# Patient Record
Sex: Female | Born: 1955 | Race: Black or African American | Hispanic: No | Smoking: Never smoker
Health system: Southern US, Community
[De-identification: ages and names within clinical notes are randomized; demographics above are authoritative.]

## PROBLEM LIST (undated history)

## (undated) DIAGNOSIS — I10 Essential (primary) hypertension: Secondary | ICD-10-CM

## (undated) HISTORY — PX: TUBAL LIGATION: SHX77

---

## 1998-08-27 ENCOUNTER — Ambulatory Visit (HOSPITAL_COMMUNITY): Admission: RE | Admit: 1998-08-27 | Discharge: 1998-08-27 | Payer: Self-pay | Admitting: Obstetrics and Gynecology

## 2002-11-14 ENCOUNTER — Other Ambulatory Visit: Admission: RE | Admit: 2002-11-14 | Discharge: 2002-11-14 | Payer: Self-pay | Admitting: Obstetrics and Gynecology

## 2004-01-23 ENCOUNTER — Other Ambulatory Visit: Admission: RE | Admit: 2004-01-23 | Discharge: 2004-01-23 | Payer: Self-pay | Admitting: Obstetrics and Gynecology

## 2005-02-16 ENCOUNTER — Other Ambulatory Visit: Admission: RE | Admit: 2005-02-16 | Discharge: 2005-02-16 | Payer: Self-pay | Admitting: Obstetrics and Gynecology

## 2007-04-01 ENCOUNTER — Encounter: Admission: RE | Admit: 2007-04-01 | Discharge: 2007-04-01 | Payer: Self-pay | Admitting: Obstetrics and Gynecology

## 2015-01-23 ENCOUNTER — Other Ambulatory Visit: Payer: Self-pay | Admitting: Surgical

## 2015-02-06 NOTE — H&P (Signed)
[name] is an 59 y.o. female.   Chief Complaint: back pain HPI: The patient is a 59 year old female who presents with the chief complaint of low back pain which radiates into the right leg. She reports that the pain started about a year ago with no known injury. She reports that it has gotten progressively worse with time despite treatment with NSAIDs, prednisone, and activity modification. She has also developed weakness in her right toe extensors and dorsiflexors. MRI of the lumbar spine shows that she has a large central and to the right ruptured disc with caudal extrusion.  No pertinent medical history  No pertinent surgical history  Social History: nonsmoker; drinks wine occasionally  Allergies: No known allergies  Medication  Diclofenac Sodium (  Tablet DR, Oral) Active. PredniSONE (  Tablet, Oral) Active. Percocet 5/325 1-2 every 4-6 hours PRN pain  Review of Systems  Constitutional: Positive for diaphoresis. Negative for fever, chills, weight loss and malaise/fatigue.  HENT: Negative.   Eyes: Negative.   Respiratory: Negative.   Cardiovascular: Negative.   Gastrointestinal: Negative.   Genitourinary: Negative.   Musculoskeletal: Positive for back pain. Negative for myalgias, joint pain, falls and neck pain.  Skin: Negative.   Neurological: Positive for tingling and sensory change. Negative for dizziness, tremors, speech change, focal weakness, seizures, loss of consciousness and weakness.  Endo/Heme/Allergies: Negative.   Psychiatric/Behavioral: Negative.    Vitals  Weight: 152 lb Height: 61in Body Surface Area: 1.68 m Body Mass Index: 28.72 kg/m  Pulse: 85 (Regular)  BP: 145/106 (Sitting, Left Arm, Standard)  Physical Exam  Constitutional: She is oriented to person, place, and time. She appears well-developed and well-nourished. No distress.  HENT:  Head: Normocephalic and atraumatic.  Right Ear: External ear normal.  Left Ear: External ear  normal.  Nose: Nose normal.  Mouth/Throat: Oropharynx is clear and moist.  Eyes: Conjunctivae and EOM are normal.  Neck: Normal range of motion. Neck supple.  Cardiovascular: Normal rate, regular rhythm, normal heart sounds and intact distal pulses.   No murmur heard. Respiratory: Effort normal and breath sounds normal. No respiratory distress. She has no wheezes.  GI: Soft. Bowel sounds are normal. She exhibits no distension. There is no tenderness.  Musculoskeletal:       Right hip: Normal.       Left hip: Normal.       Right knee: Normal.       Left knee: Normal.       Lumbar back: She exhibits pain and spasm.  Pain with motion of the lumbar spine which radiates to the right ankle  Neurological: She is alert and oriented to person, place, and time. She has normal reflexes. No sensory deficit.  Weakness of the toe extensors and dorsiflexors of the right foot  Skin: No rash noted. She is not diaphoretic. No erythema.  Psychiatric: She has a normal mood and affect. Her behavior is normal.     Assessment/Plan Lumbar disc herniation L4-L5 right She needs a central decompressive lumbar laminectomy anLillian Ballesterectomy at 4-5 on the right. Risks and benefits of the procedure discussed with the patient by Dr. Darrelyn Hillock.   Dina Mobley LAUREN 02/06/2015, 1:49 PM

## 2015-02-18 NOTE — Patient Instructions (Addendum)
[name]  02/18/2015   Your procedure is scheduled on: 02/22/2015    Report to [facility] Main  Entrance take [place]  elevators to 3rd floor to  Short Stay Center at     0515 AM.  Call this number if you have problems the morning of surgery [phone]   Remember: ONLY 1 PERSON MAY GO WITH YOU TO SHORT STAY TO GET  READY MORNING OF YOUR SURGERY.  Do not eat food or drink liquids :After Midnight.     Take these medicines the morning of surgery with A SIP OF WATER: none                                 You may not have any metal on your body including hair pins and              piercings  Do not wear jewelry, make-up, lotions, powders or perfumes, deodorant             Do not wear nail polish.  Do not shave  48 hours prior to surgery.                Do not bring valuables to the hospital. [facility] IS NOT             RESPONSIBLE   FOR VALUABLES.  Contacts, dentures or bridgework may not be worn into surgery.  Leave suitcase in the car. After surgery it may be brought to your room.         Special Instructions: coughing and deep breathing exercises, leg exercises               Please read over the following fact sheets you were given: _____________________________________________________________________             [facility] - Preparing for Surgery Before surgery, you can play an important role.  Because skin is not sterile, your skin needs to be as free of germs as possible.  You can reduce the number of germs on your skin by washing with CHG (chlorahexidine gluconate) soap before surgery.  CHG is an antiseptic cleaner which kills germs and bonds with the skin to continue killing germs even after washing. Please DO NOT use if you have an allergy to CHG or antibacterial soaps.  If your skin becomes reddened/irritated stop using the CHG and inform your nurse when you arrive at Short Stay. Do not shave (including legs and underarms) for at least 48  hours prior to the first CHG shower.  You may shave your face/neck. Please follow these instructions carefully:  1.  Shower with CHG Soap the night before surgery and the  morning of Surgery.  2.  If you choose to wash your hair, wash your hair first as usual with your  normal  shampoo.  3.  After you shampoo, rinse your hair and body thoroughly to remove the  shampoo.                           4.  Use CHG as you would any other liquid soap.  You can apply chg directly  to the skin and wash  Gently with a scrungie or clean washcloth.  5.  Apply the CHG Soap to your body ONLY FROM THE NECK DOWN.   Do not use on face/ open                           Wound or open sores. Avoid contact with eyes, ears mouth and genitals (private parts).                       Wash face,  Genitals (private parts) with your normal soap.             6.  Wash thoroughly, paying special attention to the area where your surgery  will be performed.  7.  Thoroughly rinse your body with warm water from the neck down.  8.  DO NOT shower/wash with your normal soap after using and rinsing off  the CHG Soap.                9.  Pat yourself dry with a clean towel.            10.  Wear clean pajamas.            11.  Place clean sheets on your bed the night of your first shower and do not  sleep with pets. Day of Surgery : Do not apply any lotions/deodorants the morning of surgery.  Please wear clean clothes to the hospital/surgery center.  FAILURE TO FOLLOW THESE INSTRUCTIONS MAY RESULT IN THE CANCELLATION OF YOUR SURGERY PATIENT SIGNATURE_________________________________  NURSE SIGNATURE__________________________________  ________________________________________________________________________  WHAT IS A BLOOD TRANSFUSION? Blood Transfusion Information  A transfusion is the replacement of blood or some of its parts. Blood is made up of multiple cells which provide different functions.  Red blood cells  carry oxygen and are used for blood loss replacement.  White blood cells fight against infection.  Platelets control bleeding.  Plasma helps clot blood.  Other blood products are available for specialized needs, such as hemophilia or other clotting disorders. BEFORE THE TRANSFUSION  Who gives blood for transfusions?   Healthy volunteers who are fully evaluated to make sure their blood is safe. This is blood bank blood. Transfusion therapy is the safest it has ever been in the practice of medicine. Before blood is taken from a donor, a complete history is taken to make sure that person has no history of diseases nor engages in risky social behavior (examples are intravenous drug use or sexual activity with multiple partners). The donor's travel history is screened to minimize risk of transmitting infections, such as malaria. The donated blood is tested for signs of infectious diseases, such as HIV and hepatitis. The blood is then tested to be sure it is compatible with you in order to minimize the chance of a transfusion reaction. If you or a relative donates blood, this is often done in anticipation of surgery and is not appropriate for emergency situations. It takes many days to process the donated blood. RISKS AND COMPLICATIONS Although transfusion therapy is very safe and saves many lives, the main dangers of transfusion include:  1. Getting an infectious disease. 2. Developing a transfusion reaction. This is an allergic reaction to something in the blood you were given. Every precaution is taken to prevent this. The decision to have a blood transfusion has been considered carefully by your caregiver before blood is given. Blood is not given unless the benefits outweigh  the risks. AFTER THE TRANSFUSION  Right after receiving a blood transfusion, you will usually feel much better and more energetic. This is especially true if your red blood cells have gotten low (anemic). The transfusion  raises the level of the red blood cells which carry oxygen, and this usually causes an energy increase.  The nurse administering the transfusion will monitor you carefully for complications. HOME CARE INSTRUCTIONS  No special instructions are needed after a transfusion. You may find your energy is better. Speak with your caregiver about any limitations on activity for underlying diseases you may have. SEEK MEDICAL CARE IF:   Your condition is not improving after your transfusion.  You develop redness or irritation at the intravenous (IV) site. SEEK IMMEDIATE MEDICAL CARE IF:  Any of the following symptoms occur over the next 12 hours:  Shaking chills.  You have a temperature by mouth above 102 F (38.9 C), not controlled by medicine.  Chest, back, or muscle pain.  People around you feel you are not acting correctly or are confused.  Shortness of breath or difficulty breathing.  Dizziness and fainting.  You get a rash or develop hives.  You have a decrease in urine output.  Your urine turns a dark color or changes to pink, red, or brown. Any of the following symptoms occur over the next 10 days:  You have a temperature by mouth above 102 F (38.9 C), not controlled by medicine.  Shortness of breath.  Weakness after normal activity.  The white part of the eye turns yellow (jaundice).  You have a decrease in the amount of urine or are urinating less often.  Your urine turns a dark color or changes to pink, red, or brown. Document Released: 04/24/2000 Document Revised: 07/20/2011 Document Reviewed: 12/12/2007 ExitCare Patient Information 2014 [place].  _______________________________________________________________________  Incentive Spirometer  An incentive spirometer is a tool that can help keep your lungs clear and active. This tool measures how well you are filling your lungs with each breath. Taking long deep breaths may help reverse or decrease the chance  of developing breathing (pulmonary) problems (especially infection) following:  A long period of time when you are unable to move or be active. BEFORE THE PROCEDURE   If the spirometer includes an indicator to show your best effort, your nurse or respiratory therapist will set it to a desired goal.  If possible, sit up straight or lean slightly forward. Try not to slouch.  Hold the incentive spirometer in an upright position. INSTRUCTIONS FOR USE  3. Sit on the edge of your bed if possible, or sit up as far as you can in bed or on a chair. 4. Hold the incentive spirometer in an upright position. 5. Breathe out normally. 6. Place the mouthpiece in your mouth and seal your lips tightly around it. 7. Breathe in slowly and as deeply as possible, raising the piston or the ball toward the top of the column. 8. Hold your breath for 3-5 seconds or for as long as possible. Allow the piston or ball to fall to the bottom of the column. 9. Remove the mouthpiece from your mouth and breathe out normally. 10. Rest for a few seconds and repeat Steps 1 through 7 at least 10 times every 1-2 hours when you are awake. Take your time and take a few normal breaths between deep breaths. 11. The spirometer may include an indicator to show your best effort. Use the indicator as a goal to work  toward during each repetition. 12. After each set of 10 deep breaths, practice coughing to be sure your lungs are clear. If you have an incision (the cut made at the time of surgery), support your incision when coughing by placing a pillow or rolled up towels firmly against it. Once you are able to get out of bed, walk around indoors and cough well. You may stop using the incentive spirometer when instructed by your caregiver.  RISKS AND COMPLICATIONS  Take your time so you do not get dizzy or light-headed.  If you are in pain, you may need to take or ask for pain medication before doing incentive spirometry. It is harder to  take a deep breath if you are having pain. AFTER USE  Rest and breathe slowly and easily.  It can be helpful to keep track of a log of your progress. Your caregiver can provide you with a simple table to help with this. If you are using the spirometer at home, follow these instructions: [place] IF:   You are having difficultly using the spirometer.  You have trouble using the spirometer as often as instructed.  Your pain medication is not giving enough relief while using the spirometer.  You develop fever of 100.5 F (38.1 C) or higher. SEEK IMMEDIATE MEDICAL CARE IF:   You cough up bloody sputum that had not been present before.  You develop fever of 102 F (38.9 C) or greater.  You develop worsening pain at or near the incision site. MAKE SURE YOU:   Understand these instructions.  Will watch your condition.  Will get help right away if you are not doing well or get worse. Document Released: 09/07/2006 Document Revised: 07/20/2011 Document Reviewed: 11/08/2006 [facility] Patient Information 2014 [place], [place].   ________________________________________________________________________

## 2015-02-19 ENCOUNTER — Ambulatory Visit (HOSPITAL_COMMUNITY)
Admission: RE | Admit: 2015-02-19 | Discharge: 2015-02-19 | Disposition: A | Discharge status: Home / Self Care | Payer: BLUE CROSS/BLUE SHIELD | Source: Ambulatory Visit | Attending: Surgical | Admitting: Surgical

## 2015-02-19 ENCOUNTER — Encounter (HOSPITAL_COMMUNITY): Payer: Self-pay

## 2015-02-19 ENCOUNTER — Encounter (HOSPITAL_COMMUNITY)
Admission: RE | Admit: 2015-02-19 | Discharge: 2015-02-19 | Disposition: A | Discharge status: Home / Self Care | Payer: BLUE CROSS/BLUE SHIELD | Source: Ambulatory Visit | Attending: Orthopedic Surgery | Admitting: Orthopedic Surgery

## 2015-02-19 DIAGNOSIS — Z01812 Encounter for preprocedural laboratory examination: Secondary | ICD-10-CM | POA: Insufficient documentation

## 2015-02-19 DIAGNOSIS — Z01818 Encounter for other preprocedural examination: Secondary | ICD-10-CM | POA: Diagnosis present

## 2015-02-19 DIAGNOSIS — M5126 Other intervertebral disc displacement, lumbar region: Secondary | ICD-10-CM | POA: Diagnosis not present

## 2015-02-19 DIAGNOSIS — R9431 Abnormal electrocardiogram [ECG] [EKG]: Secondary | ICD-10-CM | POA: Diagnosis not present

## 2015-02-19 HISTORY — DX: Essential (primary) hypertension: I10

## 2015-02-19 LAB — CBC WITH DIFFERENTIAL/PLATELET
Basophils Absolute: 0 10*3/uL (ref 0.0–0.1)
Basophils Relative: 0 %
Eosinophils Absolute: 0.1 10*3/uL (ref 0.0–0.7)
Eosinophils Relative: 1 %
HCT: 42.2 % (ref 36.0–46.0)
Hemoglobin: 13.9 g/dL (ref 12.0–15.0)
Lymphocytes Relative: 50 %
Lymphs Abs: 2.4 10*3/uL (ref 0.7–4.0)
MCH: 30.3 pg (ref 26.0–34.0)
MCHC: 32.9 g/dL (ref 30.0–36.0)
MCV: 92.1 fL (ref 78.0–100.0)
Monocytes Absolute: 0.4 10*3/uL (ref 0.1–1.0)
Monocytes Relative: 8 %
Neutro Abs: 2 10*3/uL (ref 1.7–7.7)
Neutrophils Relative %: 41 %
Platelets: 240 10*3/uL (ref 150–400)
RBC: 4.58 MIL/uL (ref 3.87–5.11)
RDW: 13 % (ref 11.5–15.5)
WBC: 4.8 10*3/uL (ref 4.0–10.5)

## 2015-02-19 LAB — COMPREHENSIVE METABOLIC PANEL
ALT: 13 U/L — ABNORMAL LOW (ref 14–54)
AST: 20 U/L (ref 15–41)
Albumin: 3.8 g/dL (ref 3.5–5.0)
Alkaline Phosphatase: 84 U/L (ref 38–126)
Anion gap: 4 — ABNORMAL LOW (ref 5–15)
BUN: 17 mg/dL (ref 6–20)
CO2: 30 mmol/L (ref 22–32)
Calcium: 9.6 mg/dL (ref 8.9–10.3)
Chloride: 106 mmol/L (ref 101–111)
Creatinine, Ser: 0.92 mg/dL (ref 0.44–1.00)
GFR calc Af Amer: >60 mL/min (ref >60)
GFR calc non Af Amer: >60 mL/min (ref >60)
Glucose, Bld: 92 mg/dL (ref 65–99)
Potassium: 4 mmol/L (ref 3.5–5.1)
Sodium: 140 mmol/L (ref 135–145)
Total Bilirubin: 0.5 mg/dL (ref 0.3–1.2)
Total Protein: 7.5 g/dL (ref 6.5–8.1)

## 2015-02-19 LAB — PROTIME-INR
INR: 1.04 (ref 0.00–1.49)
Prothrombin Time: 13.8 seconds (ref 11.6–15.2)

## 2015-02-19 LAB — SURGICAL PCR SCREEN
MRSA, PCR: NEGATIVE
STAPHYLOCOCCUS AUREUS: NEGATIVE

## 2015-02-19 NOTE — Progress Notes (Signed)
Final EKG done 02/19/15 in EPIC.

## 2015-02-22 ENCOUNTER — Ambulatory Visit (HOSPITAL_COMMUNITY): Payer: BLUE CROSS/BLUE SHIELD | Admitting: Anesthesiology

## 2015-02-22 ENCOUNTER — Ambulatory Visit (HOSPITAL_COMMUNITY): Payer: BLUE CROSS/BLUE SHIELD

## 2015-02-22 ENCOUNTER — Encounter (HOSPITAL_COMMUNITY): Payer: Self-pay | Admitting: *Deleted

## 2015-02-22 ENCOUNTER — Observation Stay (HOSPITAL_COMMUNITY)
Admission: RE | Admit: 2015-02-22 | Discharge: 2015-02-23 | Disposition: A | Discharge status: Home / Self Care | Payer: BLUE CROSS/BLUE SHIELD | Source: Ambulatory Visit | Attending: Orthopedic Surgery | Admitting: Orthopedic Surgery

## 2015-02-22 ENCOUNTER — Encounter (HOSPITAL_COMMUNITY)
Admission: RE | Disposition: A | Discharge status: Home / Self Care | Payer: Self-pay | Source: Ambulatory Visit | Attending: Orthopedic Surgery

## 2015-02-22 DIAGNOSIS — M4806 Spinal stenosis, lumbar region: Secondary | ICD-10-CM | POA: Diagnosis present

## 2015-02-22 DIAGNOSIS — M48062 Spinal stenosis, lumbar region with neurogenic claudication: Secondary | ICD-10-CM | POA: Diagnosis present

## 2015-02-22 DIAGNOSIS — M545 Low back pain, unspecified: Secondary | ICD-10-CM

## 2015-02-22 DIAGNOSIS — M21371 Foot drop, right foot: Secondary | ICD-10-CM | POA: Insufficient documentation

## 2015-02-22 DIAGNOSIS — M5126 Other intervertebral disc displacement, lumbar region: Principal | ICD-10-CM | POA: Insufficient documentation

## 2015-02-22 DIAGNOSIS — I10 Essential (primary) hypertension: Secondary | ICD-10-CM | POA: Insufficient documentation

## 2015-02-22 HISTORY — PX: LUMBAR LAMINECTOMY/DECOMPRESSION MICRODISCECTOMY: SHX5026

## 2015-02-22 SURGERY — LUMBAR LAMINECTOMY/DECOMPRESSION MICRODISCECTOMY 1 LEVEL
Anesthesia: General | Site: Back | Laterality: Right

## 2015-02-22 MED ORDER — HYDROMORPHONE HCL 1 MG/ML IJ SOLN
0.5000 mg | INTRAMUSCULAR | Status: DC | PRN
  Filled 2015-02-22: qty 1

## 2015-02-22 MED ORDER — ACETAMINOPHEN 650 MG RE SUPP: 650.0000 mg | RECTAL | Status: DC | PRN

## 2015-02-22 MED ORDER — DEXAMETHASONE SODIUM PHOSPHATE 10 MG/ML IJ SOLN
INTRAMUSCULAR | Status: AC
  Filled 2015-02-22: qty 1

## 2015-02-22 MED ORDER — BACITRACIN-NEOMYCIN-POLYMYXIN 400-5-5000 EX OINT
TOPICAL_OINTMENT | CUTANEOUS | Status: AC
  Filled 2015-02-22: qty 1

## 2015-02-22 MED ORDER — CHLORHEXIDINE GLUCONATE 4 % EX LIQD: 60.0000 mL | Freq: Once | CUTANEOUS | Status: DC

## 2015-02-22 MED ORDER — CEFAZOLIN SODIUM-DEXTROSE 2-3 GM-% IV SOLR
2.0000 g | INTRAVENOUS | Status: AC
  Administered 2015-02-22: 2 g via INTRAVENOUS

## 2015-02-22 MED ORDER — BUPIVACAINE-EPINEPHRINE 0.5% -1:200000 IJ SOLN
INTRAMUSCULAR | Status: DC | PRN
  Administered 2015-02-22: 20 mL

## 2015-02-22 MED ORDER — BUPIVACAINE LIPOSOME 1.3 % IJ SUSP
20.0000 mL | Freq: Once | INTRAMUSCULAR | Status: AC
  Administered 2015-02-22: 20 mL
  Filled 2015-02-22 (×2): qty 20

## 2015-02-22 MED ORDER — CEFAZOLIN SODIUM 1-5 GM-% IV SOLN
1.0000 g | Freq: Three times a day (TID) | INTRAVENOUS | Status: AC
  Administered 2015-02-22 – 2015-02-23 (×3): 1 g via INTRAVENOUS
  Filled 2015-02-22 (×3): qty 50

## 2015-02-22 MED ORDER — PROMETHAZINE HCL 25 MG/ML IJ SOLN: 6.2500 mg | INTRAMUSCULAR | Status: DC | PRN

## 2015-02-22 MED ORDER — LIP MEDEX EX OINT
TOPICAL_OINTMENT | CUTANEOUS | Status: AC
  Administered 2015-02-22: 12:00:00
  Filled 2015-02-22: qty 7

## 2015-02-22 MED ORDER — METHOCARBAMOL 500 MG PO TABS: 500.0000 mg | ORAL_TABLET | Freq: Four times a day (QID) | ORAL | Status: DC | PRN

## 2015-02-22 MED ORDER — METHOCARBAMOL 1000 MG/10ML IJ SOLN
500.0000 mg | Freq: Four times a day (QID) | INTRAMUSCULAR | Status: DC | PRN
  Filled 2015-02-22: qty 5

## 2015-02-22 MED ORDER — ONDANSETRON HCL 4 MG/2ML IJ SOLN
INTRAMUSCULAR | Status: AC
  Filled 2015-02-22: qty 2

## 2015-02-22 MED ORDER — LACTATED RINGERS IV SOLN
INTRAVENOUS | Status: DC
  Administered 2015-02-22 (×2): via INTRAVENOUS

## 2015-02-22 MED ORDER — MENTHOL 3 MG MT LOZG: 1.0000 | LOZENGE | OROMUCOSAL | Status: DC | PRN

## 2015-02-22 MED ORDER — PROPOFOL 10 MG/ML IV BOLUS
INTRAVENOUS | Status: AC
  Filled 2015-02-22: qty 20

## 2015-02-22 MED ORDER — OXYCODONE-ACETAMINOPHEN 5-325 MG PO TABS
1.0000 | ORAL_TABLET | ORAL | Status: DC | PRN
  Filled 2015-02-22: qty 2

## 2015-02-22 MED ORDER — ESMOLOL HCL 10 MG/ML IV SOLN
INTRAVENOUS | Status: DC | PRN
  Administered 2015-02-22: 10 mg via INTRAVENOUS

## 2015-02-22 MED ORDER — PHENYLEPHRINE HCL 10 MG/ML IJ SOLN
10.0000 mg | INTRAVENOUS | Status: DC | PRN
  Administered 2015-02-22: 50 ug/min via INTRAVENOUS

## 2015-02-22 MED ORDER — BISACODYL 5 MG PO TBEC: 5.0000 mg | DELAYED_RELEASE_TABLET | Freq: Every day | ORAL | Status: DC | PRN

## 2015-02-22 MED ORDER — PHENYLEPHRINE 40 MCG/ML (10ML) SYRINGE FOR IV PUSH (FOR BLOOD PRESSURE SUPPORT)
PREFILLED_SYRINGE | INTRAVENOUS | Status: AC
  Filled 2015-02-22: qty 10

## 2015-02-22 MED ORDER — ONDANSETRON HCL 4 MG/2ML IJ SOLN
4.0000 mg | INTRAMUSCULAR | Status: DC | PRN
  Administered 2015-02-22: 4 mg via INTRAVENOUS
  Filled 2015-02-22: qty 2

## 2015-02-22 MED ORDER — METHOCARBAMOL 500 MG PO TABS: 500.0000 mg | ORAL_TABLET | Freq: Four times a day (QID) | ORAL | Status: AC | PRN

## 2015-02-22 MED ORDER — FLEET ENEMA 7-19 GM/118ML RE ENEM: 1.0000 | ENEMA | Freq: Once | RECTAL | Status: DC | PRN

## 2015-02-22 MED ORDER — DEXAMETHASONE SODIUM PHOSPHATE 10 MG/ML IJ SOLN
INTRAMUSCULAR | Status: DC | PRN
  Administered 2015-02-22: 10 mg via INTRAVENOUS

## 2015-02-22 MED ORDER — BUPIVACAINE-EPINEPHRINE (PF) 0.5% -1:200000 IJ SOLN
INTRAMUSCULAR | Status: AC
  Filled 2015-02-22: qty 30

## 2015-02-22 MED ORDER — MIDAZOLAM HCL 2 MG/2ML IJ SOLN
INTRAMUSCULAR | Status: AC
  Filled 2015-02-22: qty 4

## 2015-02-22 MED ORDER — HYDROMORPHONE HCL 1 MG/ML IJ SOLN
0.2500 mg | INTRAMUSCULAR | Status: DC | PRN
  Administered 2015-02-22 (×2): 0.5 mg via INTRAVENOUS

## 2015-02-22 MED ORDER — METOCLOPRAMIDE HCL 5 MG/ML IJ SOLN
INTRAMUSCULAR | Status: AC
  Filled 2015-02-22: qty 2

## 2015-02-22 MED ORDER — POLYETHYLENE GLYCOL 3350 17 G PO PACK: 17.0000 g | PACK | Freq: Every day | ORAL | Status: DC | PRN

## 2015-02-22 MED ORDER — SODIUM CHLORIDE 0.9 % IR SOLN
Status: AC
  Filled 2015-02-22: qty 1

## 2015-02-22 MED ORDER — MIDAZOLAM HCL 5 MG/5ML IJ SOLN
INTRAMUSCULAR | Status: DC | PRN
  Administered 2015-02-22: 1 mg via INTRAVENOUS

## 2015-02-22 MED ORDER — BACITRACIN-NEOMYCIN-POLYMYXIN 400-5-5000 EX OINT
TOPICAL_OINTMENT | CUTANEOUS | Status: DC | PRN
  Administered 2015-02-22: 1 via TOPICAL

## 2015-02-22 MED ORDER — HYDROMORPHONE HCL 1 MG/ML IJ SOLN
INTRAMUSCULAR | Status: DC | PRN
  Administered 2015-02-22 (×2): 0.5 mg via INTRAVENOUS

## 2015-02-22 MED ORDER — CEFAZOLIN SODIUM-DEXTROSE 2-3 GM-% IV SOLR
INTRAVENOUS | Status: AC
  Filled 2015-02-22: qty 50

## 2015-02-22 MED ORDER — OXYCODONE-ACETAMINOPHEN 5-325 MG PO TABS: 1.0000 | ORAL_TABLET | ORAL | Status: AC | PRN

## 2015-02-22 MED ORDER — EPHEDRINE SULFATE 50 MG/ML IJ SOLN
INTRAMUSCULAR | Status: AC
  Filled 2015-02-22: qty 1

## 2015-02-22 MED ORDER — HYDROCODONE-ACETAMINOPHEN 5-325 MG PO TABS
1.0000 | ORAL_TABLET | ORAL | Status: DC | PRN
  Administered 2015-02-22 – 2015-02-23 (×6): 2 via ORAL
  Filled 2015-02-22 (×6): qty 2

## 2015-02-22 MED ORDER — ONDANSETRON HCL 4 MG/2ML IJ SOLN
INTRAMUSCULAR | Status: DC | PRN
  Administered 2015-02-22: 4 mg via INTRAVENOUS

## 2015-02-22 MED ORDER — FENTANYL CITRATE (PF) 100 MCG/2ML IJ SOLN
INTRAMUSCULAR | Status: DC | PRN
  Administered 2015-02-22: 100 ug via INTRAVENOUS
  Administered 2015-02-22: 50 ug via INTRAVENOUS
  Administered 2015-02-22: 100 ug via INTRAVENOUS

## 2015-02-22 MED ORDER — HYDROMORPHONE HCL 1 MG/ML IJ SOLN
INTRAMUSCULAR | Status: AC
  Filled 2015-02-22: qty 1

## 2015-02-22 MED ORDER — HYDROMORPHONE HCL 2 MG/ML IJ SOLN
INTRAMUSCULAR | Status: AC
  Filled 2015-02-22: qty 1

## 2015-02-22 MED ORDER — LACTATED RINGERS IV SOLN
INTRAVENOUS | Status: DC
  Administered 2015-02-22: 12:00:00 via INTRAVENOUS

## 2015-02-22 MED ORDER — ROCURONIUM BROMIDE 100 MG/10ML IV SOLN
INTRAVENOUS | Status: DC | PRN
  Administered 2015-02-22 (×2): 10 mg via INTRAVENOUS
  Administered 2015-02-22: 40 mg via INTRAVENOUS

## 2015-02-22 MED ORDER — LIDOCAINE HCL (CARDIAC) 20 MG/ML IV SOLN
INTRAVENOUS | Status: DC | PRN
  Administered 2015-02-22: 50 mg via INTRAVENOUS

## 2015-02-22 MED ORDER — SUGAMMADEX SODIUM 500 MG/5ML IV SOLN
INTRAVENOUS | Status: AC
Start: 2015-02-22 — End: 2015-02-22
  Filled 2015-02-22: qty 5

## 2015-02-22 MED ORDER — LIDOCAINE HCL (CARDIAC) 20 MG/ML IV SOLN
INTRAVENOUS | Status: AC
  Filled 2015-02-22: qty 10

## 2015-02-22 MED ORDER — SODIUM CHLORIDE 0.9 % IR SOLN
Status: DC | PRN
  Administered 2015-02-22: 500 mL

## 2015-02-22 MED ORDER — SUGAMMADEX SODIUM 500 MG/5ML IV SOLN
INTRAVENOUS | Status: DC | PRN
  Administered 2015-02-22: 400 mg via INTRAVENOUS

## 2015-02-22 MED ORDER — PHENYLEPHRINE HCL 10 MG/ML IJ SOLN
INTRAMUSCULAR | Status: DC | PRN
  Administered 2015-02-22 (×5): 80 ug via INTRAVENOUS

## 2015-02-22 MED ORDER — SODIUM CHLORIDE 0.9 % IJ SOLN
INTRAMUSCULAR | Status: AC
  Filled 2015-02-22: qty 10

## 2015-02-22 MED ORDER — PROPOFOL 10 MG/ML IV BOLUS
INTRAVENOUS | Status: DC | PRN
  Administered 2015-02-22: 140 mg via INTRAVENOUS

## 2015-02-22 MED ORDER — ACETAMINOPHEN 325 MG PO TABS
650.0000 mg | ORAL_TABLET | ORAL | Status: DC | PRN
Start: 2015-02-22 — End: 2015-02-23

## 2015-02-22 MED ORDER — FENTANYL CITRATE (PF) 250 MCG/5ML IJ SOLN
INTRAMUSCULAR | Status: AC
  Filled 2015-02-22: qty 25

## 2015-02-22 MED ORDER — PHENOL 1.4 % MT LIQD
1.0000 | OROMUCOSAL | Status: DC | PRN
  Filled 2015-02-22: qty 177

## 2015-02-22 MED ORDER — THROMBIN 5000 UNITS EX SOLR
OROMUCOSAL | Status: DC | PRN
  Administered 2015-02-22: 09:00:00 via TOPICAL

## 2015-02-22 MED ORDER — PHENYLEPHRINE HCL 10 MG/ML IJ SOLN
INTRAMUSCULAR | Status: AC
Start: 2015-02-22 — End: 2015-02-22
  Filled 2015-02-22: qty 1

## 2015-02-22 MED ORDER — ROCURONIUM BROMIDE 100 MG/10ML IV SOLN
INTRAVENOUS | Status: AC
  Filled 2015-02-22: qty 1

## 2015-02-22 MED ORDER — THROMBIN 5000 UNITS EX SOLR
CUTANEOUS | Status: AC
  Filled 2015-02-22: qty 10000

## 2015-02-22 MED ORDER — METOCLOPRAMIDE HCL 5 MG/ML IJ SOLN
INTRAMUSCULAR | Status: DC | PRN
  Administered 2015-02-22: 10 mg via INTRAVENOUS

## 2015-02-22 MED ORDER — ESMOLOL HCL 10 MG/ML IV SOLN
INTRAVENOUS | Status: AC
  Filled 2015-02-22: qty 10

## 2015-02-22 SURGICAL SUPPLY — 40 items
BAG SPEC THK2 15X12 ZIP CLS (MISCELLANEOUS) ×1
BAG ZIPLOCK 12X15 (MISCELLANEOUS) ×3 IMPLANT
CLEANER TIP ELECTROSURG 2X2 (MISCELLANEOUS) ×3 IMPLANT
DRAPE MICROSCOPE LEICA (MISCELLANEOUS) ×3 IMPLANT
DRAPE POUCH INSTRU U-SHP 10X18 (DRAPES) ×3 IMPLANT
DRAPE SHEET LG 3/4 BI-LAMINATE (DRAPES) ×3 IMPLANT
DRAPE SURG 17X11 SM STRL (DRAPES) ×3 IMPLANT
DRSG ADAPTIC 3X8 NADH LF (GAUZE/BANDAGES/DRESSINGS) ×3 IMPLANT
DRSG PAD ABDOMINAL 8X10 ST (GAUZE/BANDAGES/DRESSINGS) ×4 IMPLANT
DURAPREP 26ML APPLICATOR (WOUND CARE) ×3 IMPLANT
ELECT BLADE TIP CTD 4 INCH (ELECTRODE) ×3 IMPLANT
ELECT REM PT RETURN 9FT ADLT (ELECTROSURGICAL) ×3
ELECTRODE REM PT RTRN 9FT ADLT (ELECTROSURGICAL) ×1 IMPLANT
GAUZE SPONGE 4X4 12PLY STRL (GAUZE/BANDAGES/DRESSINGS) ×3 IMPLANT
GLOVE BIOGEL PI IND STRL 8 (GLOVE) ×1 IMPLANT
GLOVE BIOGEL PI INDICATOR 8 (GLOVE) ×4
GLOVE ECLIPSE 8.0 STRL XLNG CF (GLOVE) ×5 IMPLANT
GOWN STRL REUS W/TWL XL LVL3 (GOWN DISPOSABLE) ×6 IMPLANT
KIT BASIN OR (CUSTOM PROCEDURE TRAY) ×3 IMPLANT
KIT POSITIONING SURG ANDREWS (MISCELLANEOUS) ×3 IMPLANT
MANIFOLD NEPTUNE II (INSTRUMENTS) ×3 IMPLANT
NDL SPNL 18GX3.5 QUINCKE PK (NEEDLE) ×2 IMPLANT
NEEDLE HYPO 22GX1.5 SAFETY (NEEDLE) ×3 IMPLANT
NEEDLE SPNL 18GX3.5 QUINCKE PK (NEEDLE) ×6 IMPLANT
PACK LAMINECTOMY ORTHO (CUSTOM PROCEDURE TRAY) ×3 IMPLANT
PATTIES SURGICAL .5 X.5 (GAUZE/BANDAGES/DRESSINGS) ×1 IMPLANT
PATTIES SURGICAL .75X.75 (GAUZE/BANDAGES/DRESSINGS) ×2 IMPLANT
PATTIES SURGICAL 1X1 (DISPOSABLE) IMPLANT
PEN SKIN MARKING BROAD (MISCELLANEOUS) ×3 IMPLANT
RUBBERBAND STERILE (MISCELLANEOUS) ×2 IMPLANT
SPONGE LAP 4X18 X RAY DECT (DISPOSABLE) ×4 IMPLANT
SPONGE SURGIFOAM ABS GEL 100 (HEMOSTASIS) ×3 IMPLANT
STAPLER VISISTAT 35W (STAPLE) ×3 IMPLANT
SUT VIC AB 0 CT1 27 (SUTURE) ×3
SUT VIC AB 0 CT1 27XBRD ANTBC (SUTURE) ×1 IMPLANT
SUT VIC AB 1 CT1 27 (SUTURE) ×9
SUT VIC AB 1 CT1 27XBRD ANTBC (SUTURE) ×3 IMPLANT
SYR 20CC LL (SYRINGE) ×3 IMPLANT
TAPE CLOTH SURG 4X10 WHT LF (GAUZE/BANDAGES/DRESSINGS) ×2 IMPLANT
TOWEL OR 17X26 10 PK STRL BLUE (TOWEL DISPOSABLE) ×3 IMPLANT

## 2015-02-22 NOTE — Anesthesia Postprocedure Evaluation (Signed)
  Anesthesia Post-op Note  Patient: Angela PimpleCheryl Waller  Procedure(s) Performed: Procedure(s) (LRB): CENTRAL DECOMPRESSION LUMBAR LAMINECTOMY MICRODISCECTOMY L4-L5 RIGHT   (1 LEVEL) (Right)  Patient Location: PACU  Anesthesia Type: General  Level of Consciousness: awake and alert   Airway and Oxygen Therapy: Patient Spontanous Breathing  Post-op Pain: mild  Post-op Assessment: Post-op Vital signs reviewed, Patient's Cardiovascular Status Stable, Respiratory Function Stable, Patent Airway and No signs of Nausea or vomiting  Last Vitals:  Filed Vitals:   02/22/15 1100  BP: 150/98  Pulse: 77  Temp: 36.6 C  Resp: 12    Post-op Vital Signs: stable   Complications: No apparent anesthesia complications

## 2015-02-22 NOTE — Interval H&P Note (Signed)
History and Physical Interval Note:  02/22/2015 6:55 AM  [name]  has presented today for surgery, with the diagnosis of HERNIATED DISC  The various methods of treatment have been discussed with the patient and family. After consideration of risks, benefits and other options for treatment, the patient has consented to  Procedure(s): CENTRAL DECOMPRESSION LUMBAR LAMINECTOMY MICRODISCECTOMY L4-L5 RIGHT   (1 LEVEL) (Right) as a surgical intervention .  The patient's history has been reviewed, patient examined, no change in status, stable for surgery.  I have reviewed the patient's chart and labs.  Questions were answered to the patient's satisfaction.     Damarco Keysor A

## 2015-02-22 NOTE — Progress Notes (Signed)
Pt is admitted to [facility] [address] from PACU. Admission VS is stable. Pt is AOx4 with no neuro deficit.

## 2015-02-22 NOTE — Anesthesia Procedure Notes (Signed)
Procedure Name: Intubation Date/Time: 02/22/2015 7:14 AM Performed by: Elektra Wartman, Nuala AlphaKRISTOPHER Pre-anesthesia Checklist: Patient identified, Emergency Drugs available, Suction available, Patient being monitored and Timeout performed Patient Re-evaluated:Patient Re-evaluated prior to inductionOxygen Delivery Method: Circle system utilized Preoxygenation: Pre-oxygenation with 100% oxygen Intubation Type: IV induction Ventilation: Mask ventilation without difficulty Laryngoscope Size: Mac and 4 Grade View: Grade II Tube type: Oral Tube size: 7.5 mm Number of attempts: 1 Airway Equipment and Method: Stylet Placement Confirmation: ETT inserted through vocal cords under direct vision,  positive ETCO2,  CO2 detector and breath sounds checked- equal and bilateral Secured at: 21 cm Tube secured with: Tape Dental Injury: Teeth and Oropharynx as per pre-operative assessment

## 2015-02-22 NOTE — Brief Op Note (Signed)
02/22/2015  9:23 AM  PATIENT:  [name]  59 y.o. female  PRE-OPERATIVE DIAGNOSIS:  HERNIATED DISC at L-4-L-5 on the right,Spinal Stenosis at L-4-L-5 and Foot Drop on the Right.  POST-OPERATIVE DIAGNOSIS:  Same as Pre-Op  PROCEDURE:  Procedure(s): CENTRAL DECOMPRESSION LUMBAR LAMINECTOMY MICRODISCECTOMY L4-L5 RIGHT   (1 LEVEL) (Right) for HNP and Spinal Stenosis.Foraminotomies for L-4 and L-5 Nerve roots for Foraminal Stenosis.  SURGEON:  Surgeon(s) and Role:    * Ranee Gosselinonald Ordean Fouts, MD - Primary    * Drucilla SchmidtJames P Aplington, MD - Assisting     ASSISTANTS:James Aplington MD  ANESTHESIA:   general  EBL:  Total I/O In: 1000 [I.V.:1000] Out: 100 [Blood:100]  BLOOD ADMINISTERED:none  DRAINS: none   LOCAL MEDICATIONS USED:  MARCAINE 20cc of 0.50% with Epinephrine at start of case and 20cc of Exparel at the end of the case.    SPECIMEN:  Source of Specimen:  L-4-L-5  DISPOSITION OF SPECIMEN:  PATHOLOGY  COUNTS:  YES  TOURNIQUET:  * No tourniquets in log *  DICTATION: .Other Dictation: Dictation Number [id number]  PLAN OF CARE: Admit for overnight observation  PATIENT DISPOSITION:  PACU - hemodynamically stable.   Delay start of Pharmacological VTE agent (>24hrs) due to surgical blood loss or risk of bleeding: yes

## 2015-02-22 NOTE — Anesthesia Preprocedure Evaluation (Signed)
Anesthesia Evaluation  Patient identified by MRN, date of birth, ID band Patient awake    Reviewed: Allergy & Precautions, NPO status , Patient's Chart, lab work & pertinent test results  Airway Mallampati: II  TM Distance: >3 FB Neck ROM: Full    Dental no notable dental hx.    Pulmonary neg pulmonary ROS,    Pulmonary exam normal breath sounds clear to auscultation       Cardiovascular hypertension, negative cardio ROS Normal cardiovascular exam Rhythm:Regular Rate:Normal     Neuro/Psych negative neurological ROS  negative psych ROS   GI/Hepatic negative GI ROS, Neg liver ROS,   Endo/Other  negative endocrine ROS  Renal/GU negative Renal ROS  negative genitourinary   Musculoskeletal negative musculoskeletal ROS (+)   Abdominal   Peds negative pediatric ROS (+)  Hematology negative hematology ROS (+)   Anesthesia Other Findings   Reproductive/Obstetrics negative OB ROS                             Anesthesia Physical  Anesthesia Plan  ASA: II  Anesthesia Plan: General   Post-op Pain Management:    Induction: Intravenous  Airway Management Planned: Oral ETT  Additional Equipment:   Intra-op Plan:   Post-operative Plan: Extubation in OR  Informed Consent: I have reviewed the patients History and Physical, chart, labs and discussed the procedure including the risks, benefits and alternatives for the proposed anesthesia with the patient or authorized representative who has indicated his/her understanding and acceptance.   Dental advisory given  Plan Discussed with: CRNA  Anesthesia Plan Comments:         Anesthesia Quick Evaluation  

## 2015-02-22 NOTE — Discharge Instructions (Addendum)
No driving while on pain medications and no lifting °Take aspirin 325mg daily to prevent blood clots °For the three few days, remove your dressing, tape a piece of saran wrap over your incision. °Take your shower, then remove the saran wrap and put a clean dressing on.. °After three days you can shower without the saran wrap.  °Call Dr. Gioffre if any wound complications or temperature of 101 degrees F or over.  °Call the office for an appointment to see Dr. Gioffre in two weeks: [phone] and ask for Dr. Gioffre's nurse, Tammy Johnson.  °

## 2015-02-22 NOTE — Transfer of Care (Signed)
Immediate Anesthesia Transfer of Care Note  Patient: [name]  Procedure(s) Performed: Procedure(s): CENTRAL DECOMPRESSION LUMBAR LAMINECTOMY MICRODISCECTOMY L4-L5 RIGHT   (1 LEVEL) (Right)  Patient Location: PACU  Anesthesia Type:General  Level of Consciousness:  sedated, patient cooperative and responds to stimulation  Airway & Oxygen Therapy:Patient Spontanous Breathing and Patient connected to face mask oxgen  Post-op Assessment:  Report given to PACU RN and Post -op Vital signs reviewed and stable  Post vital signs:  Reviewed and stable  Last Vitals:  Filed Vitals:   02/22/15 0531  BP: 155/101  Pulse:   Temp:   Resp:     Complications: No apparent anesthesia complications

## 2015-02-23 DIAGNOSIS — M5126 Other intervertebral disc displacement, lumbar region: Secondary | ICD-10-CM | POA: Diagnosis not present

## 2015-02-23 NOTE — Progress Notes (Signed)
Discharged from floor via w/c, family & belongings with pt. No changes in assessment. [name]  

## 2015-02-23 NOTE — Progress Notes (Signed)
   Subjective: 1 Day Post-Op Procedure(s) (LRB): CENTRAL DECOMPRESSION LUMBAR LAMINECTOMY MICRODISCECTOMY L4-L5 RIGHT   (1 LEVEL) (Right)  Pt up walking with therapy with mild discomfort Plan for d/c home today Doing well Patient reports pain as mild.  Objective:   VITALS:   Filed Vitals:   02/23/15 0540  BP: 135/73  Pulse: 84  Temp: 98.6 F (37 C)  Resp: 16    Lumbar incision healing well nv intact distally Good gait with walker  LABS No results for input(s): HGB, HCT, WBC, PLT in the last 72 hours.  No results for input(s): NA, K, BUN, CREATININE, GLUCOSE in the last 72 hours.   Assessment/Plan: 1 Day Post-Op Procedure(s) (LRB): CENTRAL DECOMPRESSION LUMBAR LAMINECTOMY MICRODISCECTOMY L4-L5 RIGHT   (1 LEVEL) (Right) D/c home today F/u in 2 weeks with Dr. Darrelyn HillockGioffre Pain management as needed    [name], MPAS, PA-C  02/23/2015, 8:45 AM

## 2015-02-23 NOTE — Care Management Note (Addendum)
Case Management Note  Patient Details  Name: Sharee PimpleCheryl Kleist MRN: [id number] Date of Birth: 10/01/1955  Subjective/Objective:        Microdiskectomy L4-5            Action/Plan: Pt requesting RW for home. Contacted [facility] for DME for home. Pt states her husband and sister will be at home to assist with her care.  Expected Discharge Date:  02/23/2015               Expected Discharge Plan:  Home/Self Care  In-House Referral:     Discharge planning Services  CM Consult  Post Acute Care Choice:    Choice offered to:     DME Arranged:  Walker rolling DME Agency:  [facility]   Status of Service:  Completed, signed off  Medicare Important Message Given:    Date Medicare IM Given:    Medicare IM give by:    Date Additional Medicare IM Given:    Additional Medicare Important Message give by:     If discussed at Long Length of Stay Meetings, dates discussed:    Additional Comments:  Elliot CousinShavis, Loura Pitt Ellen, RN 02/23/2015, 10:44 AM

## 2015-02-23 NOTE — Progress Notes (Signed)
Occupational Therapy Evaluation Patient Details Name: [name] MRN: [id number] DOB: 02/19/1956 Today's Date: 02/23/2015    History of Present Illness microdiscectomy L4-5   Clinical Impression   Patient presents to OT with decreased ADL independence due to pain and back precautions. She was educated in all ADL techniques and available equipment. No further OT needs. Spouse to assist as needed with ADLs at home.    Follow Up Recommendations  No OT follow up;Supervision - Intermittent    Equipment Recommendations  None recommended by OT    Recommendations for Other Services       Precautions / Restrictions Precautions Precautions: Back Precaution Comments: reviewed back precautions with patient      Mobility              Balance                                            ADL Overall ADL's : Needs assistance/impaired Eating/Feeding: Independent   Grooming: Wash/dry hands;Wash/dry face;Supervision/safety   Upper Body Bathing: Set up;Sitting   Lower Body Bathing: Minimal assistance;Moderate assistance;Sit to/from stand   Upper Body Dressing : Set up;Sitting   Lower Body Dressing: Moderate assistance;Sit to/from stand;Minimal assistance   Toilet Transfer: Supervision/safety;Ambulation;Regular Toilet;RW   Toileting- Clothing Manipulation and Hygiene: Supervision/safety;Sit to/from stand       Functional mobility during ADLs: Supervision/safety;Rolling walker General ADL Comments: Patient received sitting up in recliner. Reviewed back precautions with patient. Educated patient on AE for LB self-care. Patient reports her husband will assist with this task until she is able to bring her legs up to her or back precautions are released. Patient has already toileted without difficulty; had no difficulty using regular toilet so 3 in 1 not indicated. Explained tub/shower transfer technique to patient and to have someone with her when she  showers the first couple of times. She verbalized understanding but declined to practice. She reports she may sponge bathe initially. All education compelted.     Vision     Perception     Praxis      Pertinent Vitals/Pain Pain Assessment: 0-10 Pain Score: 4  Pain Location: back Pain Descriptors / Indicators: Aching;Sore Pain Intervention(s): Limited activity within patient's tolerance;Monitored during session     Hand Dominance     Extremity/Trunk Assessment Upper Extremity Assessment Upper Extremity Assessment: Overall WFL for tasks assessed   Lower Extremity Assessment Lower Extremity Assessment: Defer to PT evaluation       Communication Communication Communication: No difficulties   Cognition Arousal/Alertness: Awake/alert Behavior During Therapy: WFL for tasks assessed/performed Overall Cognitive Status: Within Functional Limits for tasks assessed                     General Comments       Exercises       Shoulder Instructions      Home Living Family/patient expects to be discharged to:: Private residence Living Arrangements: Spouse/significant other Available Help at Discharge: Family;Available 24 hours/day Type of Home: House Home Access: Stairs to enter [employer] of Steps: 8   Home Layout: Bed/bath upstairs;Multi-level Alternate Level Stairs-Number of Steps: split level, 8 or so steps to get from one level to next Alternate Level Stairs-Rails: Right;Left;Can reach both Bathroom Shower/Tub: Chief Strategy Officer: Standard Bathroom Accessibility: Yes How Accessible: Accessible via walker Home Equipment: None  Prior Functioning/Environment Level of Independence: Independent             OT Diagnosis: Acute pain   OT Problem List: Pain;Decreased knowledge of precautions;Decreased knowledge of use of DME or AE   OT Treatment/Interventions:      OT Goals(Current goals can be found in the care  plan section) Acute Rehab OT Goals Patient Stated Goal: home, less pain OT Goal Formulation: All assessment and education complete, DC therapy  OT Frequency:     Barriers to D/C:            Co-evaluation              End of Session Nurse Communication: Patient requests pain meds  Activity Tolerance: Patient tolerated treatment well Patient left: in chair;with call bell/phone within reach   Time: 0925-0938 OT Time Calculation (min): 13 min Charges:  OT General Charges $OT Visit: 1 Procedure OT Evaluation $Initial OT Evaluation Tier I: 1 Procedure G-Codes: OT G-codes **NOT FOR INPATIENT CLASS** Functional Assessment Tool Used: clinical judgment Functional Limitation: Self care Self Care Current Status (E4540(G8987): At least 40 percent but less than 60 percent impaired, limited or restricted Self Care Goal Status (J8119(G8988): At least 20 percent but less than 40 percent impaired, limited or restricted Self Care Discharge Status [phone](G8989): At least 40 percent but less than 60 percent impaired, limited or restricted  [name] A 02/23/2015, 10:24 AM

## 2015-02-23 NOTE — Evaluation (Signed)
Physical Therapy Evaluation Patient Details Name: [name] MRN: [id number] DOB: 1955/11/25 Today's Date: 02/23/2015   History of Present Illness  microdiscectomy L4-5  Clinical Impression  Patient evaluated by Physical Therapy with no further acute PT needs identified. All education has been completed and the patient has no further questions.  See below for any follow-up Physial Therapy or equipment needs. PT is signing off. Thank you for this referral.     Follow Up Recommendations No PT follow up    Equipment Recommendations  Rolling walker with 5" wheels    Recommendations for Other Services       Precautions / Restrictions Precautions Precautions: Back Precaution Comments: reviewed log roll, back precautions, gave handout      Mobility  Bed Mobility Overal bed mobility: Needs Assistance Bed Mobility: Rolling;Sidelying to Sit Rolling: Min guard Sidelying to sit: Min guard       General bed mobility comments: cues for log roll technique  Transfers Overall transfer level: Needs assistance Equipment used: Rolling walker (2 wheeled) Transfers: Sit to/from Stand (from bed, toilet) Sit to Stand: Min guard         General transfer comment: cues to power up with LEs, hand placement  Ambulation/Gait Ambulation/Gait assistance: Min guard Ambulation Distance (Feet): 100 Feet Assistive device: Rolling walker (2 wheeled) Gait Pattern/deviations: Step-through pattern;Decreased stride length     General Gait Details: incr time, guarded gait but steady; pt return demo's correct technique with turns  Stairs Stairs: Yes Stairs assistance: Supervision Stair Management: Two rails;Step to pattern;Forwards Number of Stairs: 5 General stair comments: cues for one step at a time and safety  Wheelchair Mobility    Modified Rankin (Stroke Patients Only)       Balance                                             Pertinent Vitals/Pain Pain  Assessment: 0-10 Pain Score: 5  Pain Location: back Pain Intervention(s): Limited activity within patient's tolerance;Monitored during session;Premedicated before session    Home Living Family/patient expects to be discharged to:: Private residence Living Arrangements: Spouse/significant other   Type of Home: House Home Access: Stairs to enter   Secretary/administratorntrance Stairs-Number of Steps: 8 Home Layout: Bed/bath upstairs;Multi-level Home Equipment: None      Prior Function Level of Independence: Independent               Hand Dominance        Extremity/Trunk Assessment   Upper Extremity Assessment: Defer to OT evaluation;Overall WFL for tasks assessed           Lower Extremity Assessment: Overall WFL for tasks assessed         Communication   Communication: No difficulties  Cognition Arousal/Alertness: Awake/alert Behavior During Therapy: WFL for tasks assessed/performed Overall Cognitive Status: Within Functional Limits for tasks assessed                      General Comments      Exercises        Assessment/Plan    PT Assessment Patent does not need any further PT services  PT Diagnosis Difficulty walking   PT Problem List    PT Treatment Interventions     PT Goals (Current goals can be found in the Care Plan section) Acute Rehab PT Goals Patient Stated Goal: home, less  pain PT Goal Formulation: All assessment and education complete, DC therapy    Frequency     Barriers to discharge        Co-evaluation               End of Session   Activity Tolerance: Patient tolerated treatment well;No increased pain Patient left: in chair;with call bell/phone within reach Nurse Communication: Mobility status    Functional Assessment Tool Used: clinical judgement Functional Limitation: Mobility: Walking and moving around Mobility: Walking and Moving Around Current Status (Z6109): At least 1 percent but less than 20 percent impaired,  limited or restricted Mobility: Walking and Moving Around Goal Status [phone]): At least 1 percent but less than 20 percent impaired, limited or restricted Mobility: Walking and Moving Around Discharge Status [phone]): At least 1 percent but less than 20 percent impaired, limited or restricted    Time: 0834-0909 PT Time Calculation (min) (ACUTE ONLY): 35 min   Charges:   PT Evaluation $Initial PT Evaluation Tier I: 1 Procedure PT Treatments $Gait Training: 8-22 mins   PT G Codes:   PT G-Codes **NOT FOR INPATIENT CLASS** Functional Assessment Tool Used: clinical judgement Functional Limitation: Mobility: Walking and moving around Mobility: Walking and Moving Around Current Status (B1478): At least 1 percent but less than 20 percent impaired, limited or restricted Mobility: Walking and Moving Around Goal Status [phone]): At least 1 percent but less than 20 percent impaired, limited or restricted Mobility: Walking and Moving Around Discharge Status [phone]): At least 1 percent but less than 20 percent impaired, limited or restricted    [facility] 02/23/2015, 9:09 AM

## 2015-02-23 NOTE — Progress Notes (Signed)
Contacted B.Dixon, PA, re walker recommended by PT. Order given. [name], [employer]

## 2015-02-23 NOTE — Discharge Summary (Signed)
Physician Discharge Summary   Patient ID: [name] MRN: [id number] DOB/AGE: 01/04/1956 59 y.o.  Admit date: 02/22/2015 Discharge date: 02/23/2015  Admission Diagnoses:  Active Problems:   Spinal stenosis, lumbar region, with neurogenic claudication   Discharge Diagnoses:  Same   Surgeries: Procedure(s): CENTRAL DECOMPRESSION LUMBAR LAMINECTOMY MICRODISCECTOMY L4-L5 RIGHT   (1 LEVEL) on 02/22/2015   Consultants: PT/OT  Discharged Condition: Stable  Hospital Course: [name] is an 59 y.o. female who was admitted 02/22/2015 with a chief complaint of No chief complaint on file. , and found to have a diagnosis of <principal problem not specified>.  They were brought to the operating room on 02/22/2015 and underwent the above named procedures.    The patient had an uncomplicated hospital course and was stable for discharge.  Recent vital signs:  Filed Vitals:   02/23/15 0540  BP: 135/73  Pulse: 84  Temp: 98.6 F (37 C)  Resp: 16    Recent laboratory studies:  Results for orders placed or performed during the hospital encounter of 02/19/15  Surgical pcr screen  Result Value Ref Range   MRSA, PCR NEGATIVE NEGATIVE   Staphylococcus aureus NEGATIVE NEGATIVE  CBC WITH DIFFERENTIAL  Result Value Ref Range   WBC 4.8 4.0 - 10.5 K/uL   RBC 4.58 3.87 - 5.11 MIL/uL   Hemoglobin 13.9 12.0 - 15.0 g/dL   HCT 78.242.2 95.636.0 - 21.346.0 %   MCV 92.1 78.0 - 100.0 fL   MCH 30.3 26.0 - 34.0 pg   MCHC 32.9 30.0 - 36.0 g/dL   RDW 08.613.0 57.811.5 - 46.915.5 %   Platelets 240 150 - 400 K/uL   Neutrophils Relative % 41 %   Neutro Abs 2.0 1.7 - 7.7 K/uL   Lymphocytes Relative 50 %   Lymphs Abs 2.4 0.7 - 4.0 K/uL   Monocytes Relative 8 %   Monocytes Absolute 0.4 0.1 - 1.0 K/uL   Eosinophils Relative 1 %   Eosinophils Absolute 0.1 0.0 - 0.7 K/uL   Basophils Relative 0 %   Basophils Absolute 0.0 0.0 - 0.1 K/uL  Comprehensive metabolic panel  Result Value Ref Range   Sodium 140 135 - 145  mmol/L   Potassium 4.0 3.5 - 5.1 mmol/L   Chloride 106 101 - 111 mmol/L   CO2 30 22 - 32 mmol/L   Glucose, Bld 92 65 - 99 mg/dL   BUN 17 6 - 20 mg/dL   Creatinine, Ser 6.290.92 0.44 - 1.00 mg/dL   Calcium 9.6 8.9 - 52.810.3 mg/dL   Total Protein 7.5 6.5 - 8.1 g/dL   Albumin 3.8 3.5 - 5.0 g/dL   AST 20 15 - 41 U/L   ALT 13 (L) 14 - 54 U/L   Alkaline Phosphatase 84 38 - 126 U/L   Total Bilirubin 0.5 0.3 - 1.2 mg/dL   GFR calc non Af Amer >60 >60 mL/min   GFR calc Af Amer >60 >60 mL/min   Anion gap 4 (L) 5 - 15  Protime-INR  Result Value Ref Range   Prothrombin Time 13.8 11.6 - 15.2 seconds   INR 1.04 0.00 - 1.49    Discharge Medications:     Medication List    TAKE these medications        methocarbamol 500 MG tablet  Commonly known as:  ROBAXIN  Take 1 tablet (500 mg total) by mouth every 6 (six) hours as needed for muscle spasms.     oxyCODONE-acetaminophen 5-325 MG tablet  Commonly known  as:  PERCOCET/ROXICET  Take 1-2 tablets by mouth every 4 (four) hours as needed for moderate pain.     SINUS RELIEF PO  Take 2 capsules by mouth daily as needed (congestion).        Diagnostic Studies: Dg Chest 2 View  02/19/2015  CLINICAL DATA:  Preop for lumbar spine surgery EXAM: CHEST  2 VIEW COMPARISON:  None. FINDINGS: No active infiltrate or effusion is seen. Mediastinal and hilar contours are unremarkable. The heart is within normal limits in size. No acute bony abnormality is seen. There are degenerative changes throughout the mid to lower thoracic spine. IMPRESSION: No active cardiopulmonary disease. Electronically Signed   By: Dwyane Dee M.D.   On: 02/19/2015 15:20   Dg Lumbar Spine 2-3 Views  02/19/2015  CLINICAL DATA:  Preoperative examination prior to lumbar spine surgery EXAM: LUMBAR SPINE - 2-3 VIEW COMPARISON:  Chest x-ray of today's date for purposes of vertebral level numbering. FINDINGS: The lumbar vertebral bodies are preserved in height. There is mild disc space  narrowing at L3-4 and moderate narrowing at L4-5 and L5-S1. There is facet joint hypertrophy at L4-5 and L5-S1. The pedicles and transverse processes are intact. The observed portions of the sacrum are normal. IMPRESSION: There is moderate multilevel degenerative disc and facet joint change. There is no compression fracture or spondylolisthesis Electronically Signed   By: David  [place] M.D.   On: 02/19/2015 15:21   Dg Spine Portable 1 View  02/22/2015  CLINICAL DATA:  Back pain, operative localization EXAM: PORTABLE SPINE - 1 VIEW COMPARISON:  02/22/2015 FINDINGS: Surgical retractor and radiopaque localizer are posterior to L4-5. Degenerative disc disease and spondylosis noted at L4-5 and L5-S1. Diffuse facet arthropathy posteriorly. IMPRESSION: Surgical localizer L4-5. Electronically Signed   By: Judie Petit.  Shick M.D.   On: 02/22/2015 08:50   Dg Spine Portable 1 View  02/22/2015  CLINICAL DATA:  Intraoperative lumbar decompression EXAM: PORTABLE SPINE - 1 VIEW COMPARISON:  Study obtained earlier in the day FINDINGS: Lateral lumbar spine image labeled #3. Metallic probe tip is posterior to the L4-5 interspace level. No fracture or spondylolisthesis. Moderate disc space narrowing is noted at L4-5 and L5-S1. IMPRESSION: Metallic probe tip is posterior to the L4-5 level. 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 Electronically Signed   By: Bretta Bang III M.D.   On: 02/22/2015 08:17   Dg Spine Portable 1 View  02/22/2015  CLINICAL DATA:  Back pain. L4-5 intraoperative surgical decompression. EXAM: PORTABLE SPINE - 1 VIEW COMPARISON:  02/22/2015 FINDINGS: Intraoperative cross-table lateral radiograph shows 2 probes overlying the posterior spinous processes of L4 and L5. IMPRESSION: Intraoperative localization of L4 and L5 posterior spinous processes. Electronically Signed   By: Myles Rosenthal M.D.   On: 02/22/2015 07:56   Dg Spine Portable 1 View  02/22/2015  CLINICAL DATA:  Low back pain. Intraoperative  surgical decompression EXAM: PORTABLE SPINE - 1 VIEW COMPARISON:  None. FINDINGS: Lateral lumbar image is labeled #1. Metallic probe tips are posterior to the inferior aspect of the L4 vertebral body and the mid aspect of the L5 vertebral body. There is no fracture or spondylolisthesis. There is moderate disc space narrowing at L4-5 and L5-S1. IMPRESSION: Metallic probe tips are posterior to the inferior aspect of the L4 vertebral body and the mid aspect of the L5 vertebral body. Moderate osteoarthritic change. No fracture or spondylolisthesis. Electronically Signed   By: Bretta Bang III M.D.   On: 02/22/2015 07:46    Disposition: Final  discharge disposition not confirmed      Discharge Instructions    Call MD / Call 911    Complete by:  As directed   If you experience chest pain or shortness of breath, CALL 911 and be transported to the hospital emergency room.  If you develope a fever above 101 F, pus (white drainage) or increased drainage or redness at the wound, or calf pain, call your surgeon's office.     Constipation Prevention    Complete by:  As directed   Drink plenty of fluids.  Prune juice may be helpful.  You may use a stool softener, such as Colace (over the counter) 100 mg twice a day.  Use MiraLax (over the counter) for constipation as needed.     Diet general    Complete by:  As directed      Discharge instructions    Complete by:  As directed   No driving while on pain medications and no lifting Take aspirin  daily to prevent blood clots For the three few days, remove your dressing, tape a piece of saran wrap over your incision. Take your shower, then remove the saran wrap and put a clean dressing on.. After three days you can shower without the saran wrap.  Call Dr. Darrelyn Hillock if any wound complications or temperature of 101 degrees F or over.  Call the office for an appointment to see Dr. Darrelyn Hillock in two weeks: [phone] and ask for Dr. Jeannetta Ellis nurse, Mackey Birchwood.     Increase activity slowly as tolerated    Complete by:  As directed            Follow-up Information    Follow up with GIOFFRE,RONALD A, MD. Schedule an appointment as soon as possible for a visit in 2 weeks.   Specialty:  Orthopedic Surgery   Contact information:   [address] [place] [address] [phone]        Signed: Thea Gist 02/23/2015, 8:46 AM

## 2015-02-23 NOTE — Op Note (Signed)
[name], [name]              ACCOUNT NO.:  [id number]  MEDICAL RECORD NO.:  [id number]  LOCATION:  1618                         FACILITY:  [facility]  PHYSICIAN:  Georges Lynch. Shaima Sardinas, M.D.DATE OF BIRTH:  Jun 15, 1955  DATE OF PROCEDURE:  02/22/2015 DATE OF DISCHARGE:                              OPERATIVE REPORT   SURGEON:  Georges Lynch. Darrelyn Hillock, M.D.  ASSISTANT:  Marlowe Kays, M.D.  PREOPERATIVE DIAGNOSES: 1. 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 2. Severe spinal stenosis at L4-5. 3. Partial footdrop on the right.  POSTOPERATIVE DIAGNOSES: 1. 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 2. Severe spinal stenosis at L4-5. 3. Partial footdrop on the right.  OPERATION: 1. Complete decompressive lumbar laminectomy at L4-5 for spinal     stenosis. 2. Foraminotomies for the L4 and L5 roots bilaterally for foraminal     stenosis. 3. Microdiskectomy for large herniated lumbar disk at 4-5 on the     right.  DESCRIPTION OF PROCEDURE:  Under general anesthesia, routine orthopedic prepping and draping of the lower back was carried out with the patient on the spinal frame.  The appropriate time-out was first carried out.  I also marked the appropriate right side of her back in the holding area. At this time, 2 needles were placed in the back for localization purposes.  After the time-out, x-ray was taken.  I then made an incision over the L4-5 space in the usual fashion.  The patient had 2 g of IV Ancef.  Bleeders were identified and cauterized.  Self-retaining retractors were inserted.  Incision then was carried down to the spinous processes and the muscle was separated from the lamina and spinous processes bilaterally at L4-5.  Kocher clamps were placed on the spinous processes for identification.  Another x-ray was taken.  At this time, I then went down and started my central decompressive lumbar laminectomy at L4-5.  Great care was taken to protect the dura at all  times.  Note, this area was extremely tight so a great deal of time and care was taken at this point.  The microscope was brought in.  We then went up proximally and then distally and protected the dura with cottonoids.  We then removed the large thickened ligamentum flavum in this area.  I then went down and utilized a hockey-stick to make sure we decompressed her enough distally and proximally.  We then went out and did foraminotomies in usual fashion, protecting the dura at all times.  We decompressed the lateral recesses as well.  We were able to easily pass a hockey-stick out the foramina at this time.  After the thorough decompression and the use of the microscope, we gently retracted the 5 root and identified the herniated disk.  Cruciate incision was made in the herniated disk after another x-ray was taken.  At this time, by utilizing the nerve hook and the Epstein curettes, we were able to tease out large fragments of disk material.  After this was completed, we then went down into the space and completed the microdiskectomy.  After that was completed, the nerve root and dura now was extremely free and we were  able to easily move back and forth without a problem.  We thoroughly irrigated out the area. Once again, used a hockey-stick to make sure the foramina were open.  We irrigated the wound out and then removed fluid and then loosely applied some thrombin-soaked Gelfoam.  I closed the wound layers in usual fashion except I left a small deep distal and proximal part of the wound open for drainage purposes.  Subcu was closed with #1 Vicryl.  Prior to closure, I injected 20 mL of Exparel.  At the beginning the procedure, I injected 20 mL of 0.5% Marcaine with epinephrine in soft tissue to control the bleeding.  The subcu as I mentioned was closed with #1 Vicryl, skin with metal staples, and a sterile Neosporin dressing was applied.  The patient left the operating room in a  satisfactory condition.          ______________________________ Georges Lynchonald A. Darrelyn HillockGioffre, M.D.     RAG/MEDQ  D:  02/22/2015  T:  02/23/2015  Job:  [id number]

## 2015-02-25 ENCOUNTER — Encounter (HOSPITAL_COMMUNITY): Payer: Self-pay | Admitting: Orthopedic Surgery

## 2016-06-21 IMAGING — DX DG SPINE 1V PORT
1 series · 1 of 1 positions shown · non-contrast
Comparison: 02/22/2015

CLINICAL DATA: Back pain, operative localization

EXAM:
PORTABLE SPINE - 1 VIEW

[l-spine x-table]
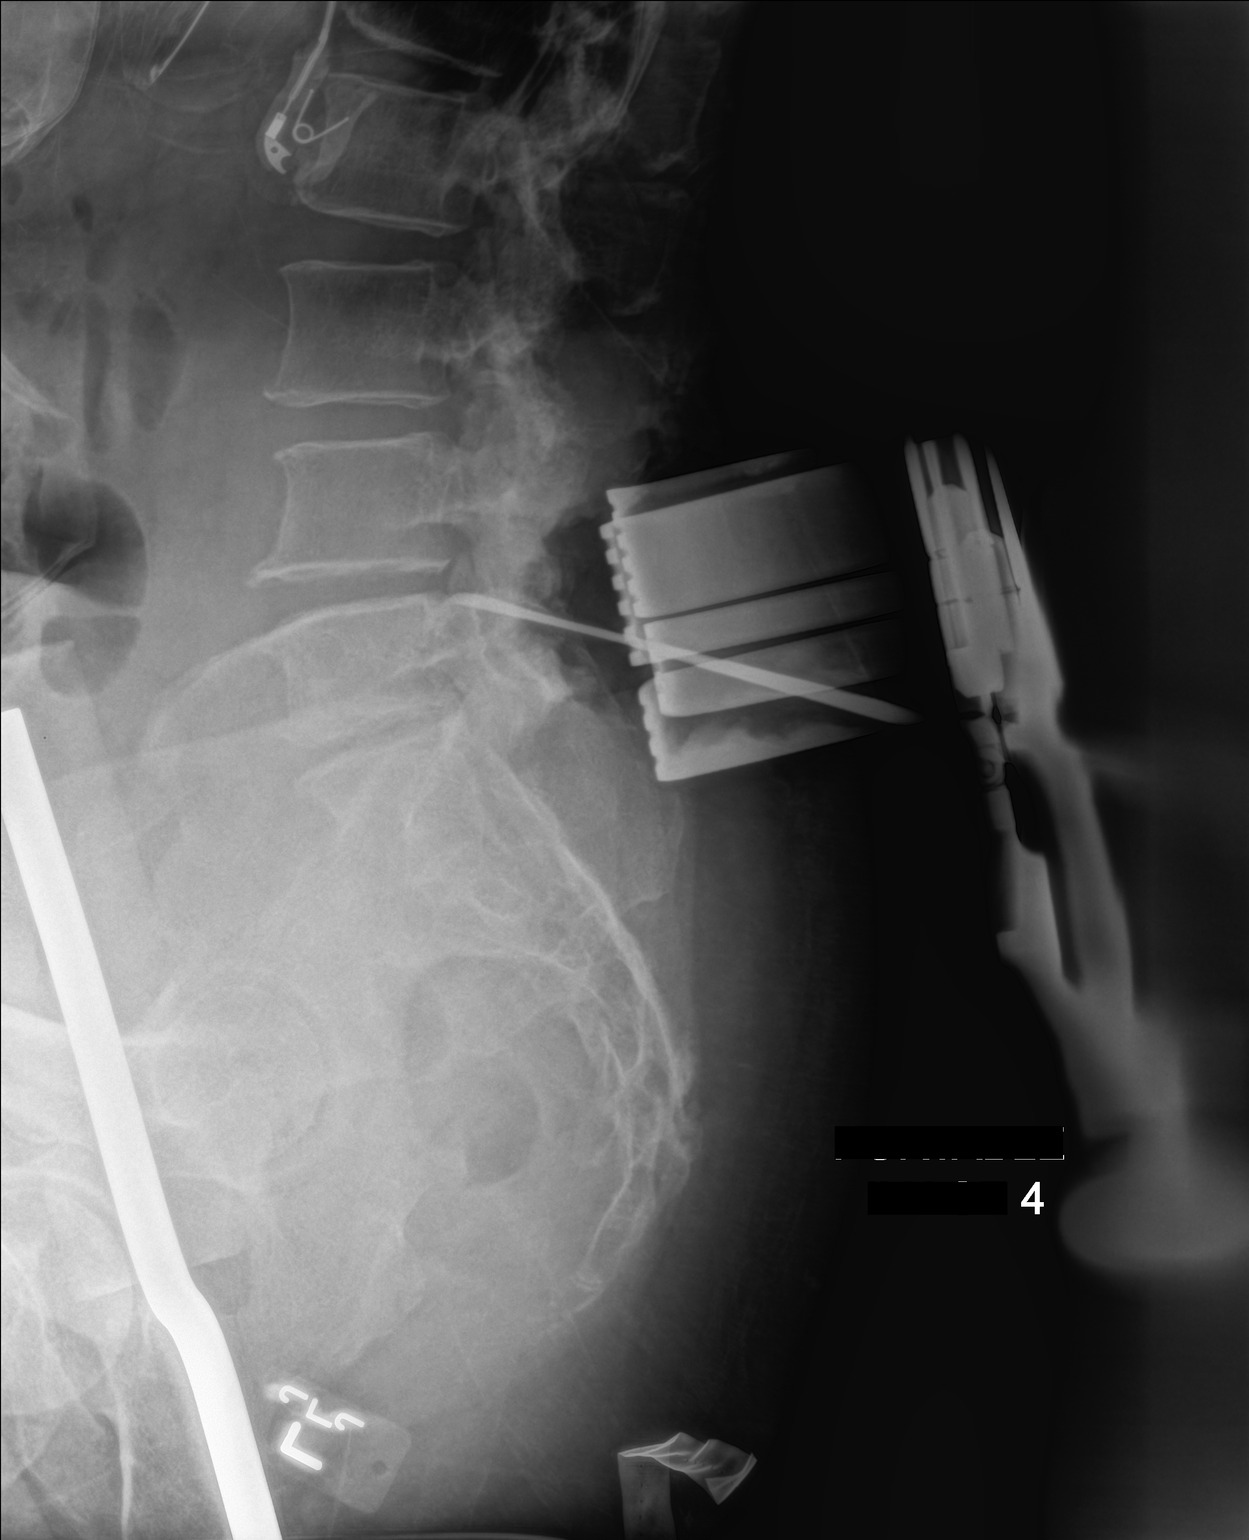

[1 of 1 positions shown; findings below may reference images not displayed]

FINDINGS: Surgical retractor and radiopaque localizer are posterior to L4-5.
Degenerative disc disease and spondylosis noted at L4-5 and L5-S1.
Diffuse facet arthropathy posteriorly.
IMPRESSION: Surgical localizer L4-5.

## 2017-12-14 ENCOUNTER — Other Ambulatory Visit: Payer: Self-pay | Admitting: Family Medicine

## 2017-12-14 ENCOUNTER — Ambulatory Visit
Admission: RE | Admit: 2017-12-14 | Discharge: 2017-12-14 | Disposition: A | Discharge status: Home / Self Care | Payer: BLUE CROSS/BLUE SHIELD | Source: Ambulatory Visit | Attending: Family Medicine | Admitting: Family Medicine

## 2017-12-14 DIAGNOSIS — M25511 Pain in right shoulder: Secondary | ICD-10-CM

## 2019-04-13 IMAGING — CR DG SHOULDER 2+V*L*
3 series · 3 of 3 positions shown · non-contrast
Comparison: None.

CLINICAL DATA: C/o acute persistent LEFT posterior shoulder pain
x8days, radiates into elbow posteriorly / limited ROM, inability to
lift arm / aching, throbbing / had IM steroid injection yesterday,
feels better today, more ROM and able to lift arm.

EXAM:
LEFT SHOULDER - 2+ VIEW

[w shoulder ap internal left]
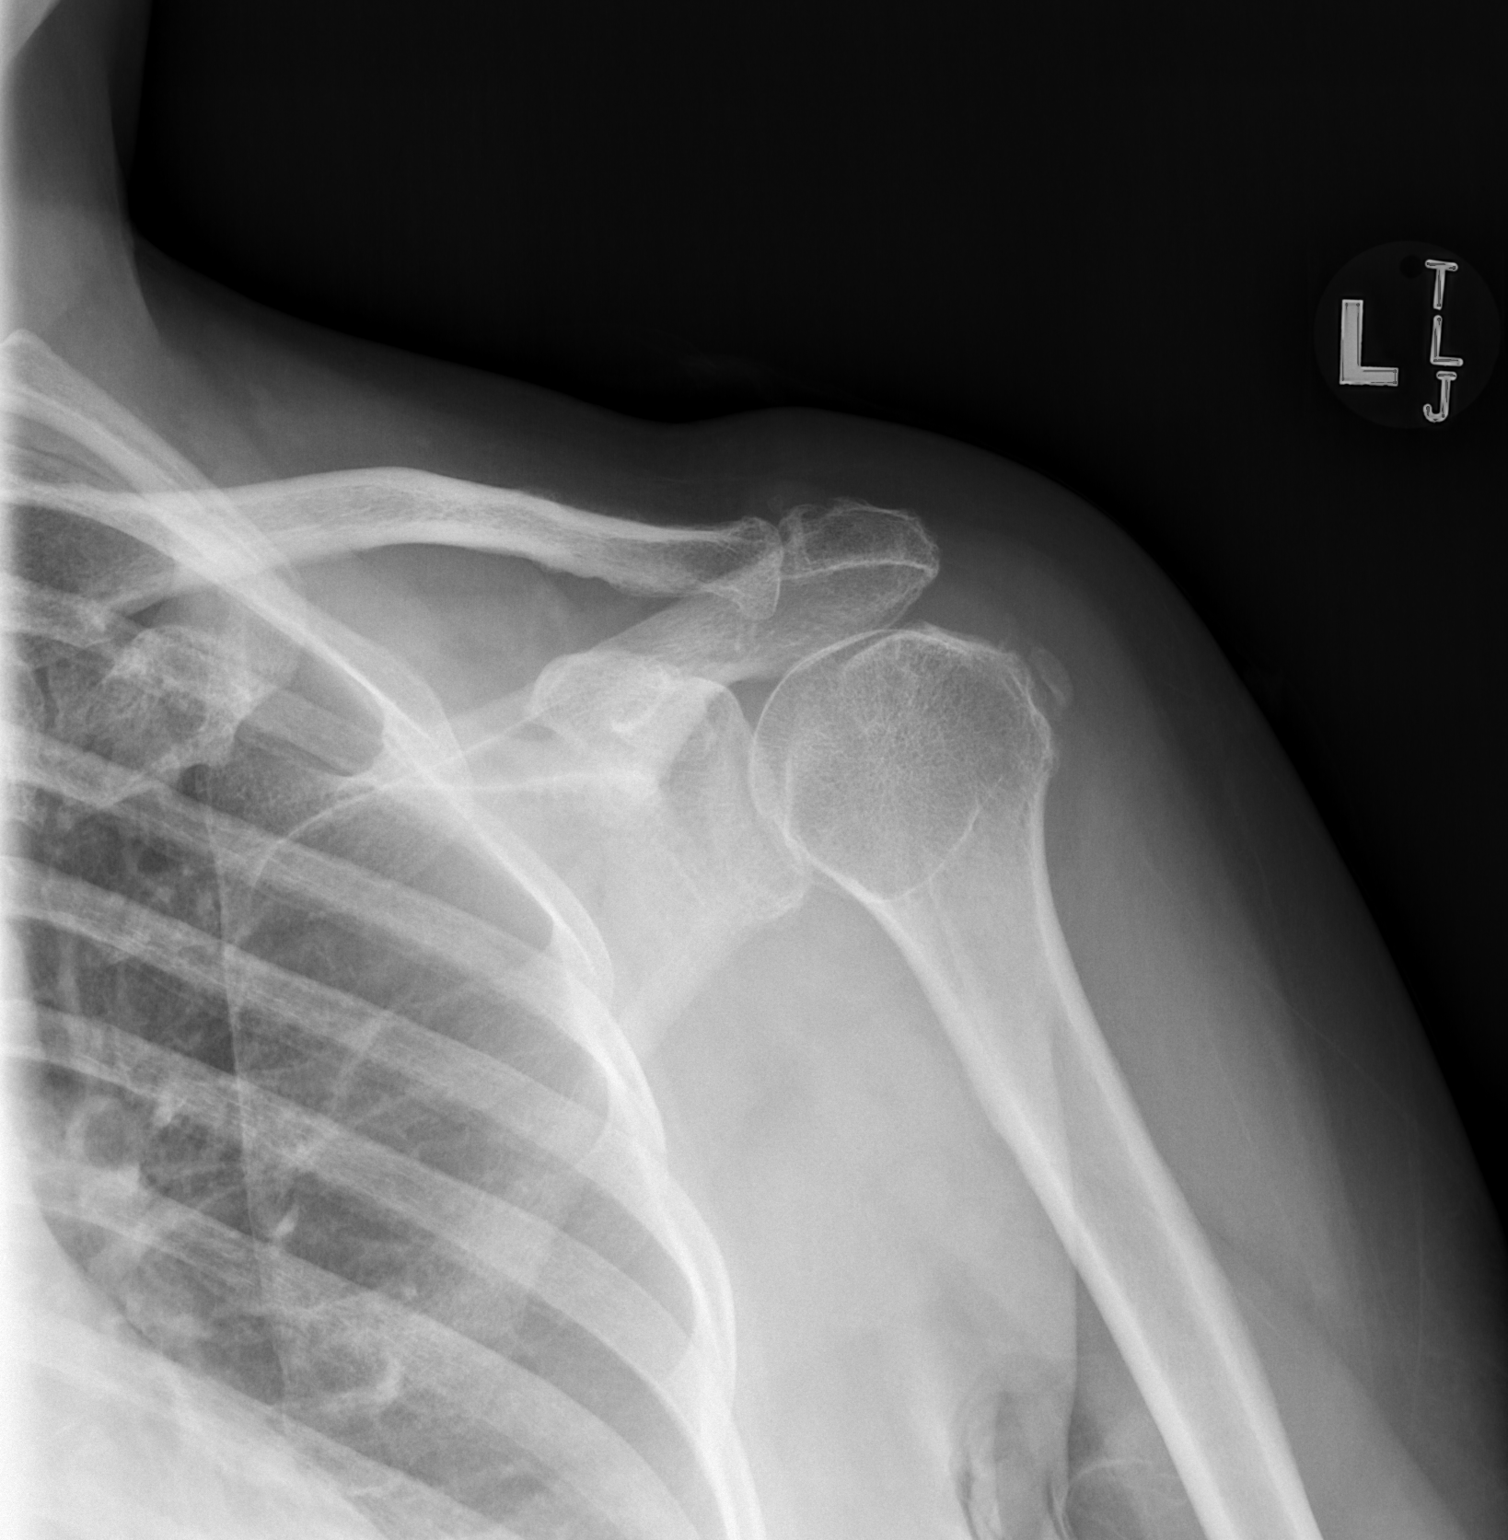

[w shoulder y view left]
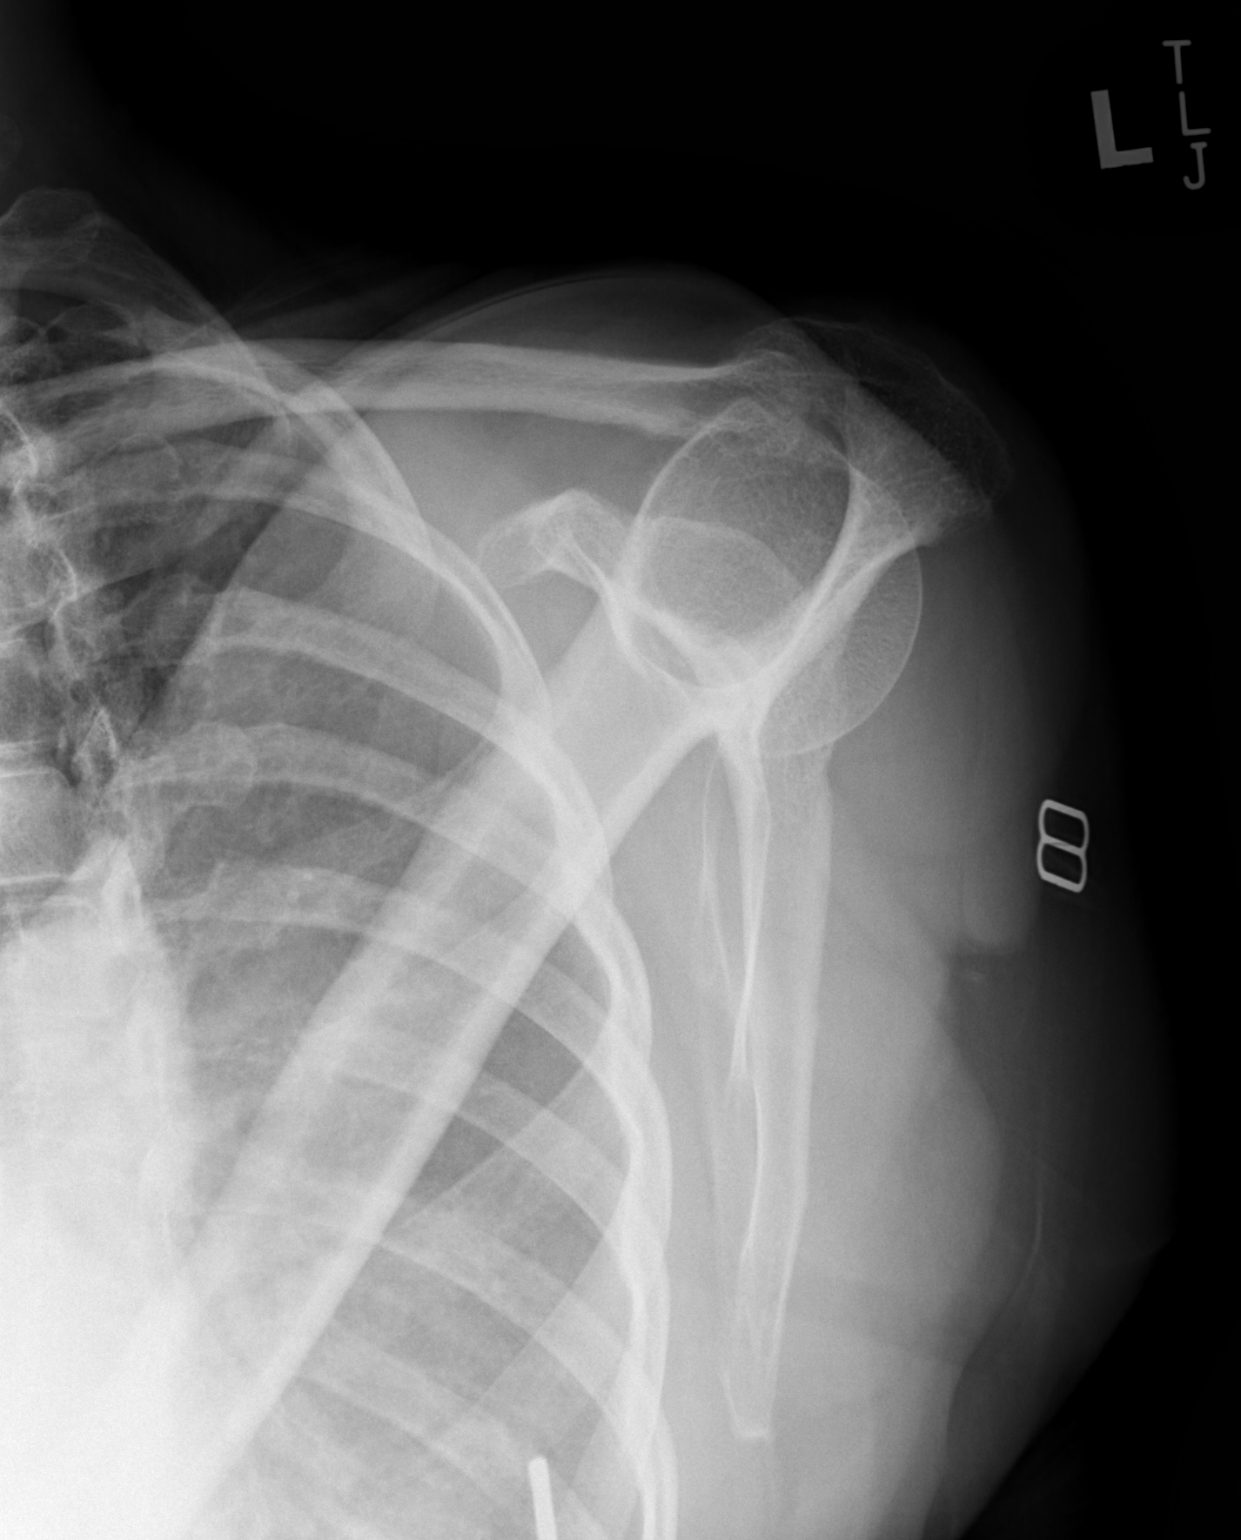

[w shoulder axillary left *]
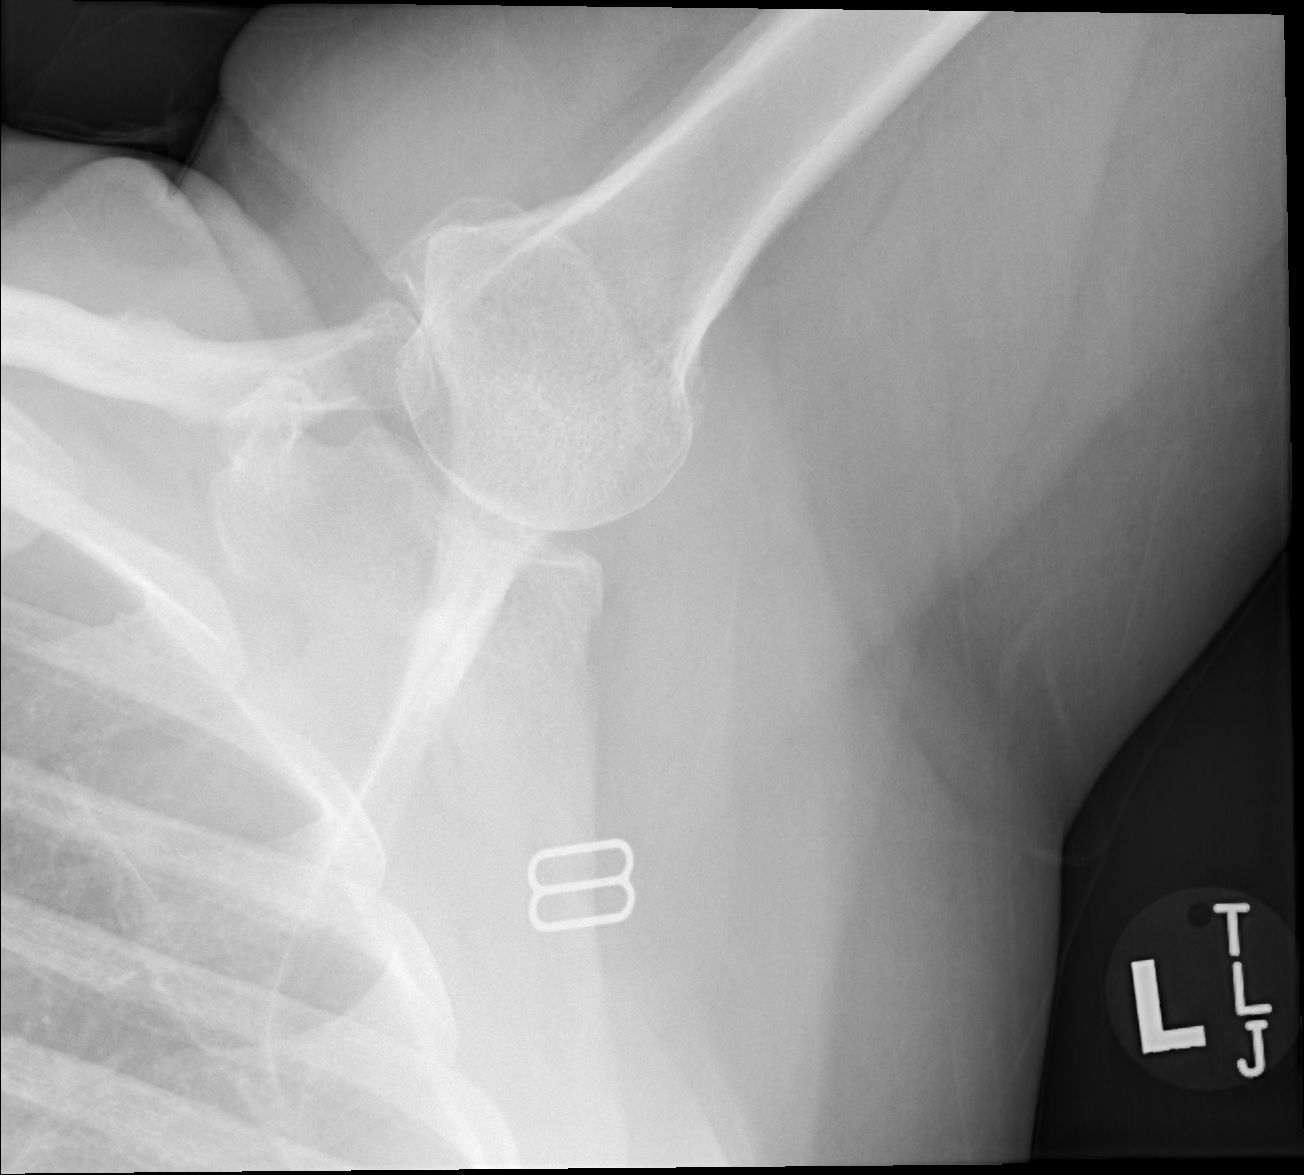

[3 of 3 positions shown; findings below may reference images not displayed]

FINDINGS: There is subacromial narrowing. Femoral head osteophytes are
present. There is no acute fracture or subluxation. LEFT lung apex
is unremarkable in appearance.
IMPRESSION: No evidence for acute abnormality. Subacromial narrowing and humeral
head osteophytes.

## 2024-03-14 ENCOUNTER — Other Ambulatory Visit: Payer: Self-pay | Admitting: Obstetrics and Gynecology

## 2024-03-14 DIAGNOSIS — Z8249 Family history of ischemic heart disease and other diseases of the circulatory system: Secondary | ICD-10-CM
